# Patient Record
Sex: Female | Born: 1960 | Race: White | Hispanic: No | Marital: Married | Smoking: Never smoker
Health system: Southern US, Community
[De-identification: ages and names within clinical notes are randomized; demographics above are authoritative.]

## PROBLEM LIST (undated history)

## (undated) DIAGNOSIS — M810 Age-related osteoporosis without current pathological fracture: Secondary | ICD-10-CM

## (undated) DIAGNOSIS — E7800 Pure hypercholesterolemia, unspecified: Secondary | ICD-10-CM

## (undated) HISTORY — DX: Pure hypercholesterolemia, unspecified: E78.00

## (undated) HISTORY — DX: Age-related osteoporosis without current pathological fracture: M81.0

---

## 1997-07-02 ENCOUNTER — Other Ambulatory Visit: Admission: RE | Admit: 1997-07-02 | Discharge: 1997-07-02 | Payer: Self-pay | Admitting: Obstetrics and Gynecology

## 1997-08-13 ENCOUNTER — Ambulatory Visit (HOSPITAL_COMMUNITY): Admission: RE | Admit: 1997-08-13 | Discharge: 1997-08-13 | Payer: Self-pay | Admitting: Obstetrics and Gynecology

## 1997-08-21 ENCOUNTER — Other Ambulatory Visit: Admission: RE | Admit: 1997-08-21 | Discharge: 1997-08-21 | Payer: Self-pay | Admitting: *Deleted

## 1997-12-21 ENCOUNTER — Inpatient Hospital Stay (HOSPITAL_COMMUNITY): Admission: AD | Admit: 1997-12-21 | Discharge: 1997-12-21 | Payer: Self-pay | Admitting: Obstetrics & Gynecology

## 1997-12-23 ENCOUNTER — Inpatient Hospital Stay (HOSPITAL_COMMUNITY): Admission: AD | Admit: 1997-12-23 | Discharge: 1997-12-25 | Payer: Self-pay | Admitting: Obstetrics and Gynecology

## 1997-12-28 ENCOUNTER — Encounter (HOSPITAL_COMMUNITY): Admission: RE | Admit: 1997-12-28 | Discharge: 1998-01-11 | Payer: Self-pay | Admitting: Obstetrics and Gynecology

## 1998-03-21 ENCOUNTER — Other Ambulatory Visit: Admission: RE | Admit: 1998-03-21 | Discharge: 1998-03-21 | Payer: Self-pay | Admitting: *Deleted

## 1998-06-27 ENCOUNTER — Other Ambulatory Visit: Admission: RE | Admit: 1998-06-27 | Discharge: 1998-06-27 | Payer: Self-pay | Admitting: *Deleted

## 1999-07-03 ENCOUNTER — Other Ambulatory Visit: Admission: RE | Admit: 1999-07-03 | Discharge: 1999-07-03 | Payer: Self-pay | Admitting: Obstetrics and Gynecology

## 2001-02-23 ENCOUNTER — Encounter: Payer: Self-pay | Admitting: Obstetrics and Gynecology

## 2001-02-23 ENCOUNTER — Ambulatory Visit (HOSPITAL_COMMUNITY): Admission: RE | Admit: 2001-02-23 | Discharge: 2001-02-23 | Payer: Self-pay | Admitting: Obstetrics and Gynecology

## 2002-03-31 ENCOUNTER — Ambulatory Visit (HOSPITAL_COMMUNITY): Admission: RE | Admit: 2002-03-31 | Discharge: 2002-03-31 | Payer: Self-pay | Admitting: Obstetrics and Gynecology

## 2002-03-31 ENCOUNTER — Encounter: Payer: Self-pay | Admitting: Obstetrics and Gynecology

## 2003-04-02 ENCOUNTER — Ambulatory Visit (HOSPITAL_COMMUNITY): Admission: RE | Admit: 2003-04-02 | Discharge: 2003-04-02 | Payer: Self-pay | Admitting: Obstetrics and Gynecology

## 2004-03-24 ENCOUNTER — Other Ambulatory Visit: Admission: RE | Admit: 2004-03-24 | Discharge: 2004-03-24 | Payer: Self-pay | Admitting: Obstetrics and Gynecology

## 2004-04-03 ENCOUNTER — Ambulatory Visit (HOSPITAL_COMMUNITY): Admission: RE | Admit: 2004-04-03 | Discharge: 2004-04-03 | Payer: Self-pay | Admitting: Obstetrics and Gynecology

## 2005-03-25 ENCOUNTER — Other Ambulatory Visit: Admission: RE | Admit: 2005-03-25 | Discharge: 2005-03-25 | Payer: Self-pay | Admitting: Obstetrics and Gynecology

## 2005-04-01 ENCOUNTER — Encounter: Admission: RE | Admit: 2005-04-01 | Discharge: 2005-04-01 | Payer: Self-pay | Admitting: Obstetrics and Gynecology

## 2006-04-12 ENCOUNTER — Encounter: Admission: RE | Admit: 2006-04-12 | Discharge: 2006-04-12 | Payer: Self-pay | Admitting: Obstetrics and Gynecology

## 2007-02-01 IMAGING — MG MM MAMMO DIAG BILATERAL
2 series · 2 of 2 positions shown · non-contrast
Comparison: none

DIGITAL BILATERAL DIAGNOSTIC MAMMOGRAM AND RIGHT BREAST ULTRASOUND:
CLINICAL DATA: There is a dense fibroglandular densities.  There is no suspicious mass or malignant type 
microcalcifications in either breast.  Comparison study is dated 04-02-03.

On physical exam, I do not palpate a discrete mass in the right breast.  Sonographic evaluation of 
the inferior aspect of the right breast reveals normal fibroglandular tissue.

[R MLO]
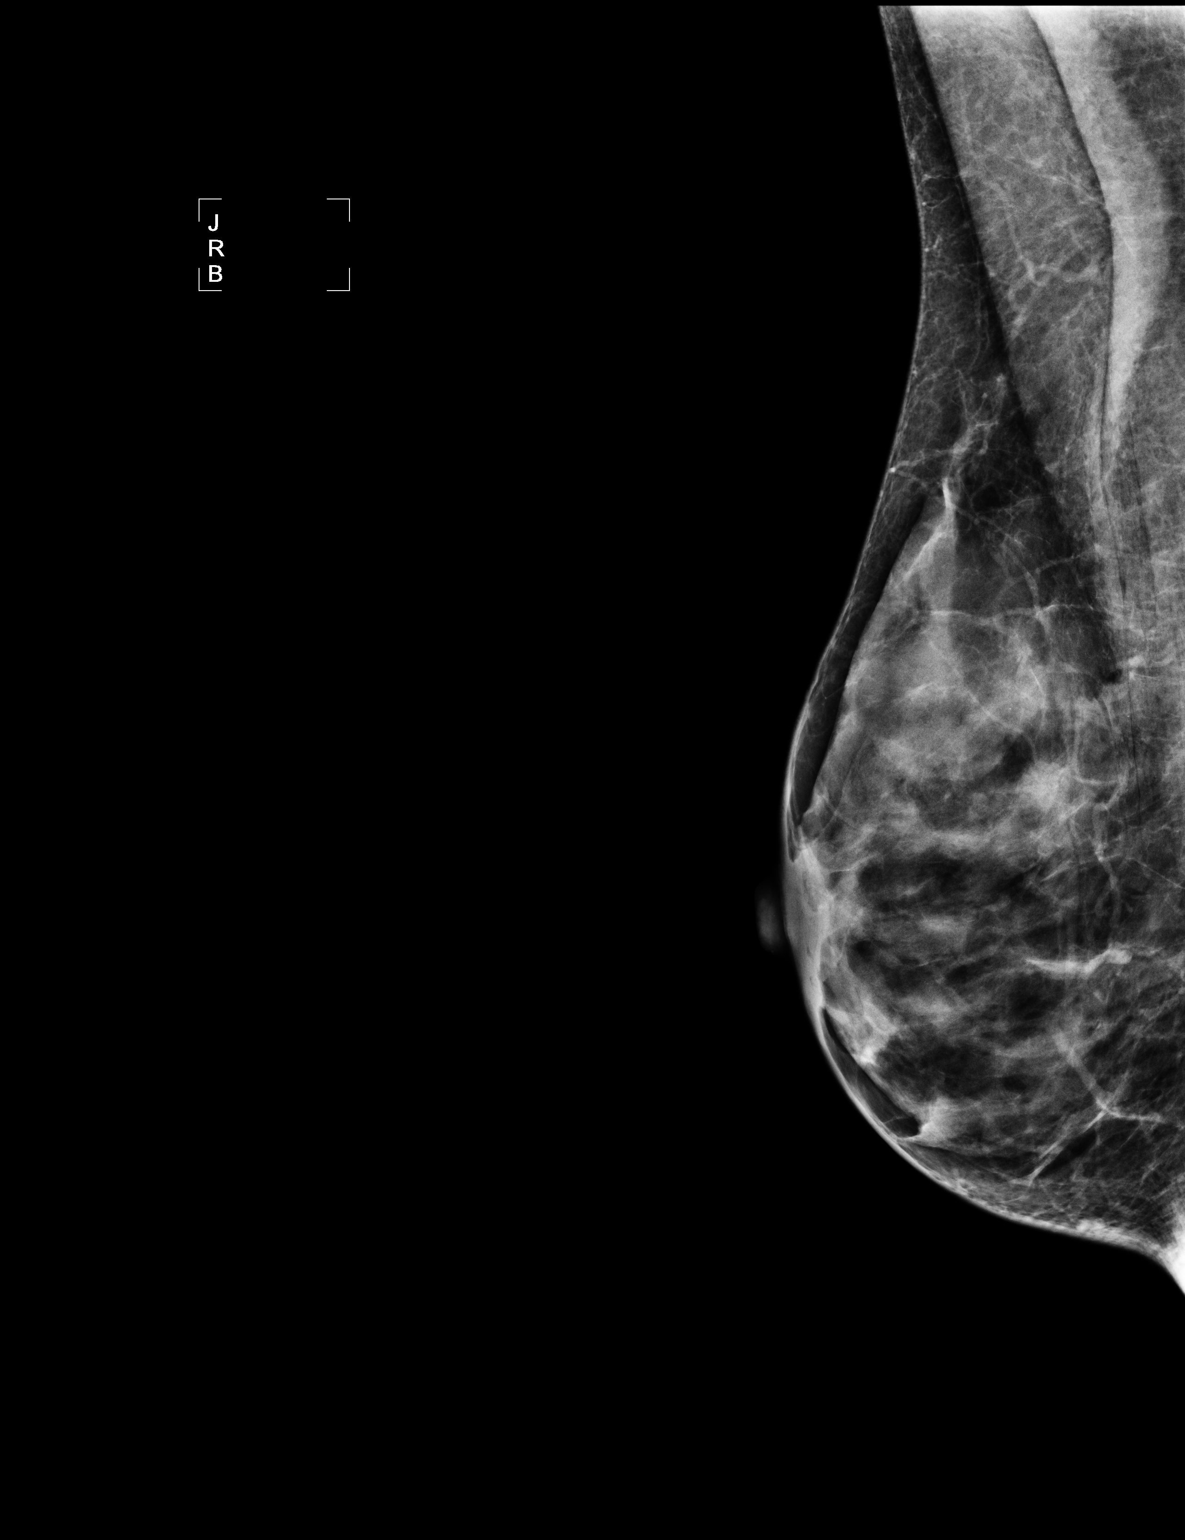

[R ML]
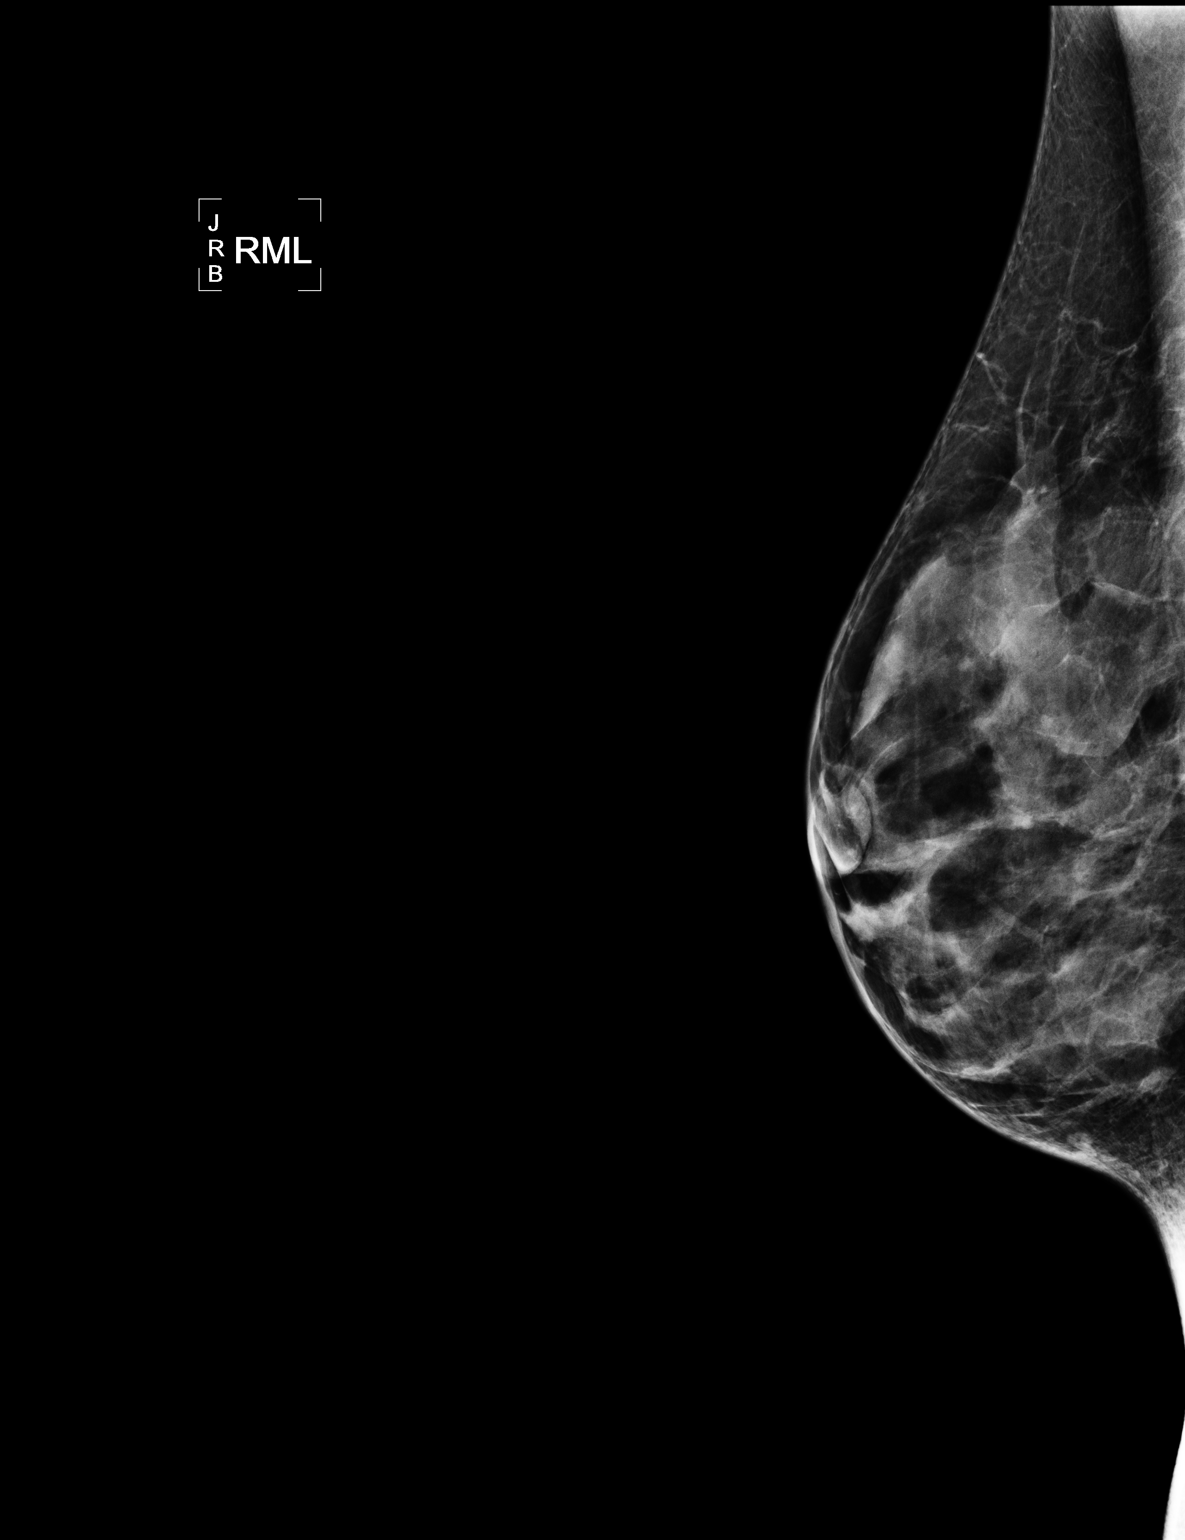

[2 of 2 positions shown; findings below may reference images not displayed]

IMPRESSION: No evidence of malignancy in either breast.  Screening mammogram in one year is recommended.

ASSESSMENT: Negative - BI-RADS 1

Routine screening mammogram in 1 year.

## 2007-04-18 ENCOUNTER — Encounter: Admission: RE | Admit: 2007-04-18 | Discharge: 2007-04-18 | Payer: Self-pay | Admitting: Obstetrics and Gynecology

## 2007-07-19 ENCOUNTER — Ambulatory Visit (HOSPITAL_COMMUNITY): Admission: RE | Admit: 2007-07-19 | Discharge: 2007-07-19 | Payer: Self-pay | Admitting: Obstetrics and Gynecology

## 2007-07-19 ENCOUNTER — Encounter (INDEPENDENT_AMBULATORY_CARE_PROVIDER_SITE_OTHER): Payer: Self-pay | Admitting: Obstetrics and Gynecology

## 2008-04-19 ENCOUNTER — Encounter: Admission: RE | Admit: 2008-04-19 | Discharge: 2008-04-19 | Payer: Self-pay | Admitting: Obstetrics and Gynecology

## 2009-05-01 ENCOUNTER — Encounter: Admission: RE | Admit: 2009-05-01 | Discharge: 2009-05-01 | Payer: Self-pay | Admitting: Obstetrics and Gynecology

## 2009-05-15 ENCOUNTER — Encounter: Admission: RE | Admit: 2009-05-15 | Discharge: 2009-05-15 | Payer: Self-pay | Admitting: Obstetrics and Gynecology

## 2010-04-22 ENCOUNTER — Other Ambulatory Visit: Payer: Self-pay | Admitting: Obstetrics and Gynecology

## 2010-04-22 DIAGNOSIS — Z1231 Encounter for screening mammogram for malignant neoplasm of breast: Secondary | ICD-10-CM

## 2010-05-23 ENCOUNTER — Ambulatory Visit
Admission: RE | Admit: 2010-05-23 | Discharge: 2010-05-23 | Disposition: A | Discharge status: Home / Self Care | Payer: BC Managed Care – PPO | Source: Ambulatory Visit | Attending: Obstetrics and Gynecology | Admitting: Obstetrics and Gynecology

## 2010-05-23 DIAGNOSIS — Z1231 Encounter for screening mammogram for malignant neoplasm of breast: Secondary | ICD-10-CM

## 2010-06-17 NOTE — Op Note (Signed)
NAMEKRISTIANNE, Emily Castillo NO.:  [id number]   MEDICAL RECORD NO.:  [id number]          PATIENT TYPE:  AMB   LOCATION:  [facility]                           FACILITY:  [facility]   PHYSICIAN:  Osborn Coho, M.D.   DATE OF BIRTH:  December 06, 1960   DATE OF PROCEDURE:  07/19/2007  DATE OF DISCHARGE:                               OPERATIVE REPORT   PREOPERATIVE DIAGNOSES:  1. Menorrhagia.  2. Polyps.   POSTOPERATIVE DIAGNOSES:  1. Menorrhagia.  2. Polyps.   PROCEDURE:  1. Hysteroscopy.  2. D&C.   ATTENDING:  Osborn Coho, MD   ANESTHESIA:  General via LMA.   FLUIDS:  1600 mL.   URINE OUTPUT:  Straight cath prior to procedure.   HYSTEROSCOPIC FLUID DEFICIT:  55 mL of LR.   ESTIMATED BLOOD LOSS:  Minimal.   COMPLICATIONS:  None.   SPECIMENS TO PATHOLOGY:  Endometrial curettings with polyps.   PROCEDURE:  The patient was taken to the operating room after the risks,  benefits, and alternatives discussed with the patient.  The patient  verbalized understanding and consent signed and witnessed.  The patient  was placed under general per anesthesia and prepped and draped in a  normal sterile fashion in the dorsal lithotomy position.  A bivalve  speculum was placed in the patient's vagina and the anterior lip of the  cervix grasped with single-tooth tenaculum.  A paracervical block was  administered using a total of 10 mL of 1% lidocaine.  The uterus was  sounded to approximately 8-9 cm and hysteroscope was introduced and  polypoid lesion noted on the posterior wall of the uterus near the  fundus.  Curettage was performed until a gray texture was noted and  curettings with polyp sent to  pathology.  Hysteroscope was introduced again and no obvious  intracavitary lesion was noted.  All instruments were removed.  There  was good hemostasis at the tenaculum site.  Count was correct.  The  patient tolerated the procedure well and is currently awaiting transfer  to the recovery  room in good condition.      Osborn Coho, M.D.  Electronically Signed     AR/MEDQ  D:  07/19/2007  T:  07/20/2007  Job:  [id number]

## 2010-10-30 LAB — CBC
HCT: 39.2
Hemoglobin: 13.5
MCHC: 34.5
RDW: 15
WBC: 6.4

## 2010-10-30 LAB — PREGNANCY, URINE: Preg Test, Ur: NEGATIVE

## 2011-03-18 IMAGING — MG MM DIGITAL DIAG LTD L
3 series · 3 of 3 positions shown · non-contrast
Comparison: 05/01/2009, 04/19/2008, 04/18/2007, 04/01/2005.

CLINICAL DATA: Possible mass left breast identified on recent
screening mammogram.

DIGITAL DIAGNOSTIC LEFT MAMMOGRAM WITHOUT CAD AND LEFT BREAST
ULTRASOUND:

[L MLO]
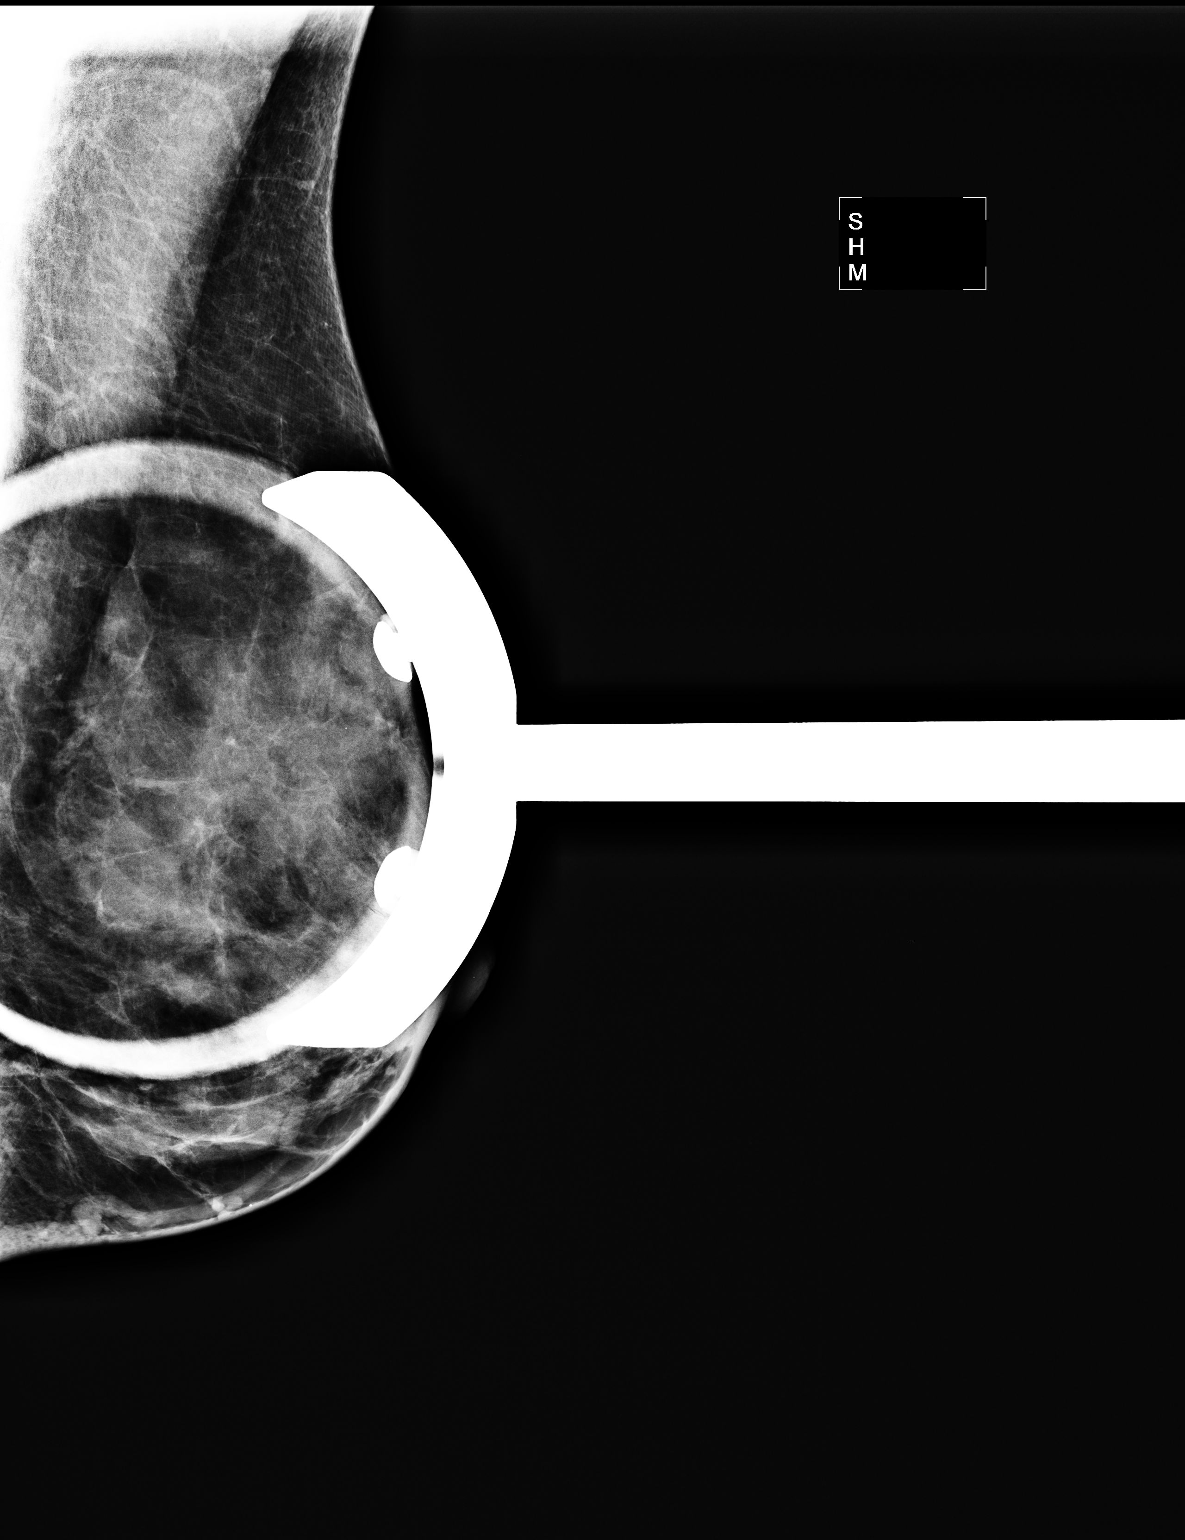

[L XCCL]
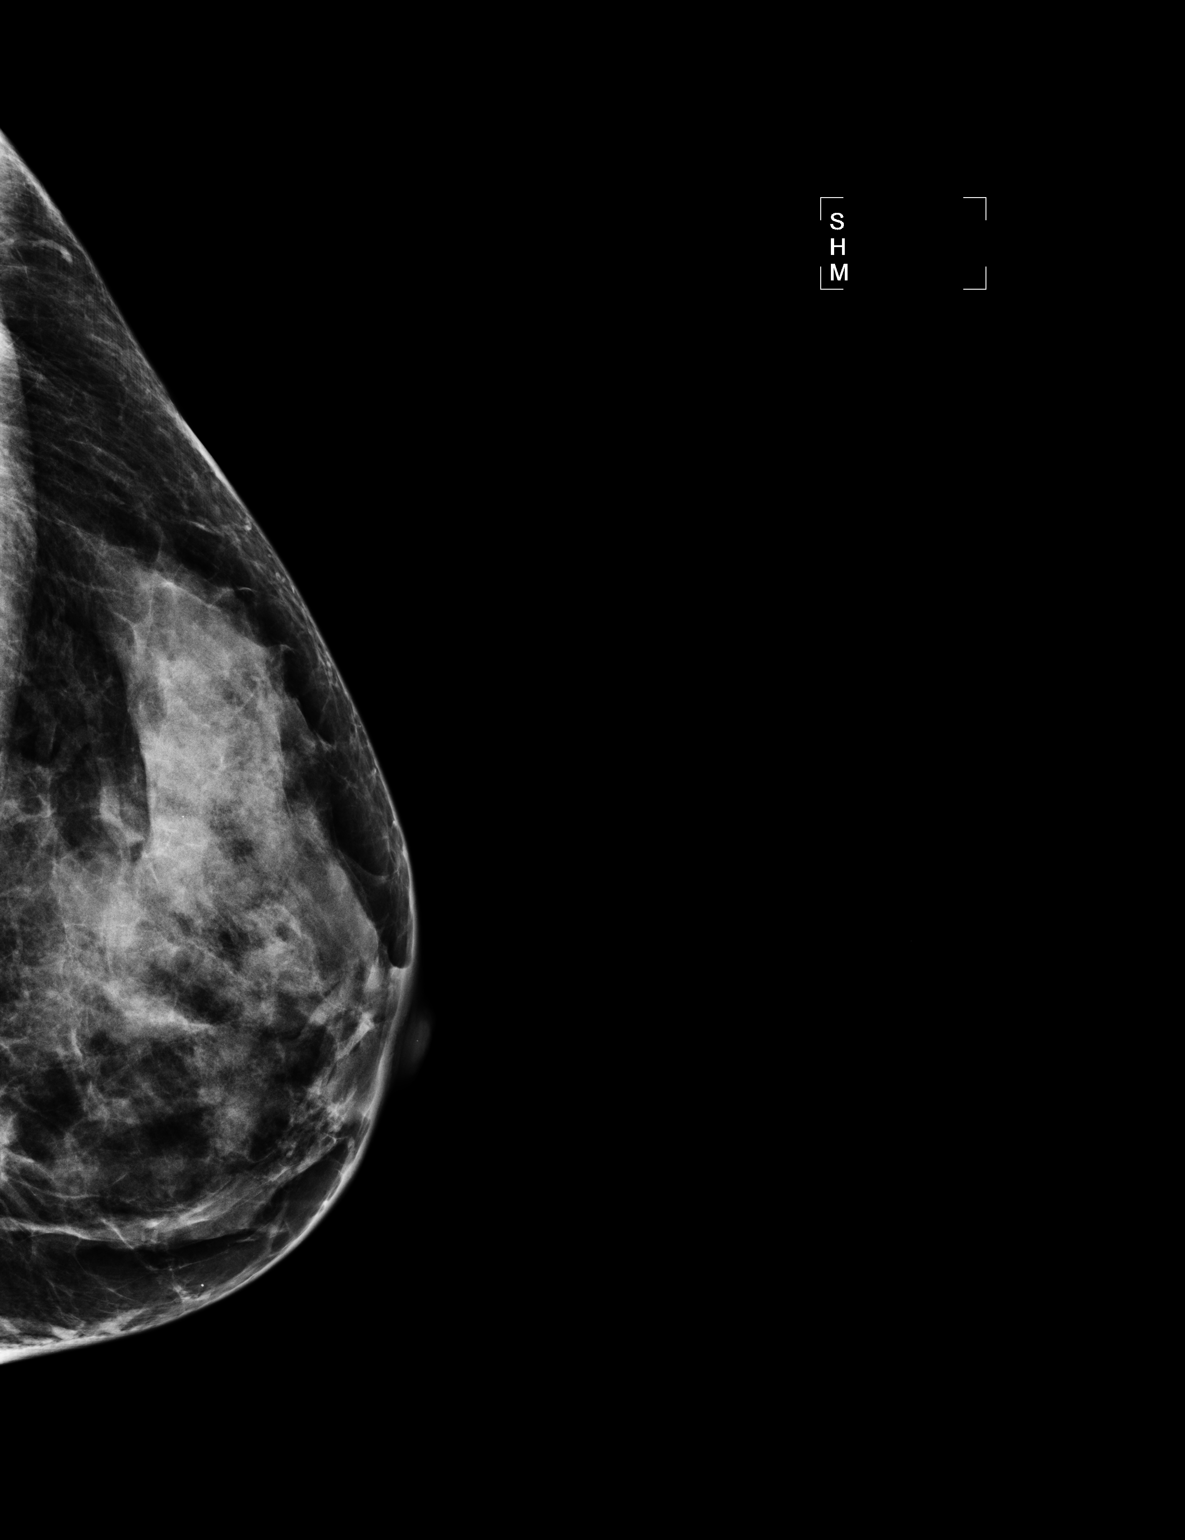

[L ML]
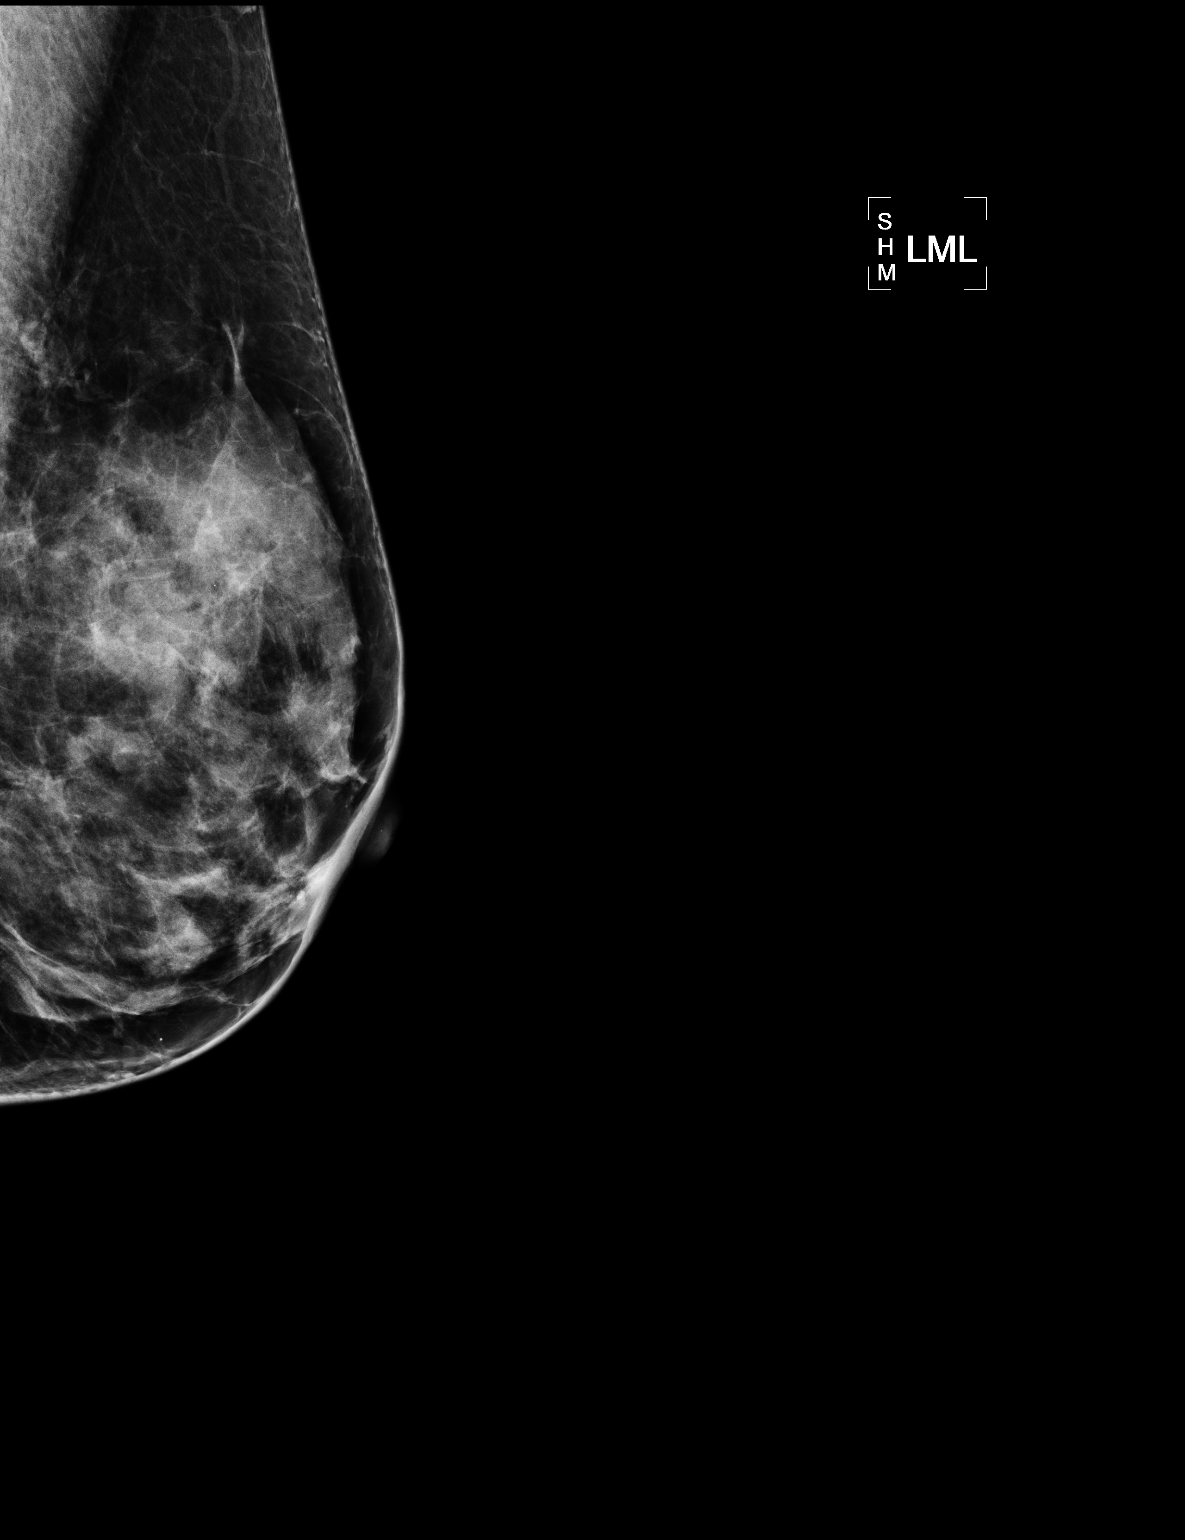

[3 of 3 positions shown; findings below may reference images not displayed]

FINDINGS: Focal spot compression view of the left breast shows
extremely dense breast parenchyma without a discrete mass or
distortion.  On a 90 degrees lateral view of the left breast and an
exaggerated CC lateral view of the left breast, extremely dense
breast parenchyma is imaged; there are no suspicious findings.

On physical exam, no mass is palpated in the upper left breast.

Ultrasound is performed, showing dense breast parenchyma.  No solid
or cystic mass or acoustical shadowing is identified.
IMPRESSION: No evidence of malignancy is identified in the left breast.
Bilateral screening mammogram in 1 year is recommended.

BI-RADS CATEGORY 1:  Negative.

## 2011-04-15 ENCOUNTER — Other Ambulatory Visit: Payer: Self-pay | Admitting: Obstetrics and Gynecology

## 2011-04-15 DIAGNOSIS — Z1231 Encounter for screening mammogram for malignant neoplasm of breast: Secondary | ICD-10-CM

## 2011-04-22 ENCOUNTER — Ambulatory Visit (INDEPENDENT_AMBULATORY_CARE_PROVIDER_SITE_OTHER): Payer: BC Managed Care – PPO | Admitting: Obstetrics and Gynecology

## 2011-04-22 DIAGNOSIS — Z01419 Encounter for gynecological examination (general) (routine) without abnormal findings: Secondary | ICD-10-CM

## 2011-04-22 DIAGNOSIS — Z1389 Encounter for screening for other disorder: Secondary | ICD-10-CM

## 2011-05-20 ENCOUNTER — Telehealth: Payer: Self-pay

## 2011-05-20 NOTE — Telephone Encounter (Signed)
LM for pt to cb on my direct ext. To discuss lab results. JO CMA

## 2011-05-21 ENCOUNTER — Telehealth: Payer: Self-pay

## 2011-05-21 NOTE — Telephone Encounter (Signed)
Notified pt of labwork results and at her request I faxed them to her. Emily Castillo A

## 2011-05-29 ENCOUNTER — Ambulatory Visit
Admission: RE | Admit: 2011-05-29 | Discharge: 2011-05-29 | Disposition: A | Discharge status: Home / Self Care | Payer: BC Managed Care – PPO | Source: Ambulatory Visit | Attending: Obstetrics and Gynecology | Admitting: Obstetrics and Gynecology

## 2011-05-29 DIAGNOSIS — Z1231 Encounter for screening mammogram for malignant neoplasm of breast: Secondary | ICD-10-CM

## 2012-04-29 ENCOUNTER — Other Ambulatory Visit: Payer: Self-pay

## 2012-04-29 DIAGNOSIS — Z1231 Encounter for screening mammogram for malignant neoplasm of breast: Secondary | ICD-10-CM

## 2012-06-03 ENCOUNTER — Ambulatory Visit
Admission: RE | Admit: 2012-06-03 | Discharge: 2012-06-03 | Disposition: A | Discharge status: Home / Self Care | Payer: BC Managed Care – PPO | Source: Ambulatory Visit

## 2012-06-03 DIAGNOSIS — Z1231 Encounter for screening mammogram for malignant neoplasm of breast: Secondary | ICD-10-CM

## 2013-05-22 ENCOUNTER — Other Ambulatory Visit: Payer: Self-pay

## 2013-05-22 DIAGNOSIS — Z1231 Encounter for screening mammogram for malignant neoplasm of breast: Secondary | ICD-10-CM

## 2013-06-09 ENCOUNTER — Encounter (INDEPENDENT_AMBULATORY_CARE_PROVIDER_SITE_OTHER): Payer: Self-pay

## 2013-06-09 ENCOUNTER — Ambulatory Visit
Admission: RE | Admit: 2013-06-09 | Discharge: 2013-06-09 | Disposition: A | Discharge status: Home / Self Care | Payer: 59 | Source: Ambulatory Visit

## 2013-06-09 DIAGNOSIS — Z1231 Encounter for screening mammogram for malignant neoplasm of breast: Secondary | ICD-10-CM

## 2014-02-02 DIAGNOSIS — C439 Malignant melanoma of skin, unspecified: Secondary | ICD-10-CM

## 2014-02-02 HISTORY — DX: Malignant melanoma of skin, unspecified: C43.9

## 2014-06-01 ENCOUNTER — Other Ambulatory Visit: Payer: Self-pay

## 2014-06-01 DIAGNOSIS — Z1231 Encounter for screening mammogram for malignant neoplasm of breast: Secondary | ICD-10-CM

## 2014-06-15 ENCOUNTER — Ambulatory Visit
Admission: RE | Admit: 2014-06-15 | Discharge: 2014-06-15 | Disposition: A | Discharge status: Home / Self Care | Payer: 59 | Source: Ambulatory Visit

## 2014-06-15 DIAGNOSIS — Z1231 Encounter for screening mammogram for malignant neoplasm of breast: Secondary | ICD-10-CM

## 2015-04-29 ENCOUNTER — Other Ambulatory Visit: Payer: Self-pay

## 2015-04-29 DIAGNOSIS — Z1231 Encounter for screening mammogram for malignant neoplasm of breast: Secondary | ICD-10-CM

## 2015-06-21 ENCOUNTER — Ambulatory Visit
Admission: RE | Admit: 2015-06-21 | Discharge: 2015-06-21 | Disposition: A | Discharge status: Home / Self Care | Payer: 59 | Source: Ambulatory Visit | Attending: Obstetrics and Gynecology | Admitting: Obstetrics and Gynecology

## 2015-06-21 DIAGNOSIS — Z1231 Encounter for screening mammogram for malignant neoplasm of breast: Secondary | ICD-10-CM

## 2016-05-13 ENCOUNTER — Other Ambulatory Visit: Payer: Self-pay | Admitting: Obstetrics and Gynecology

## 2016-05-13 DIAGNOSIS — Z1231 Encounter for screening mammogram for malignant neoplasm of breast: Secondary | ICD-10-CM

## 2016-06-26 ENCOUNTER — Ambulatory Visit
Admission: RE | Admit: 2016-06-26 | Discharge: 2016-06-26 | Disposition: A | Discharge status: Home / Self Care | Payer: 59 | Source: Ambulatory Visit | Attending: Obstetrics and Gynecology | Admitting: Obstetrics and Gynecology

## 2016-06-26 DIAGNOSIS — Z1231 Encounter for screening mammogram for malignant neoplasm of breast: Secondary | ICD-10-CM

## 2017-05-18 ENCOUNTER — Other Ambulatory Visit: Payer: Self-pay | Admitting: Obstetrics and Gynecology

## 2017-05-18 DIAGNOSIS — Z1231 Encounter for screening mammogram for malignant neoplasm of breast: Secondary | ICD-10-CM

## 2017-07-02 ENCOUNTER — Ambulatory Visit
Admission: RE | Admit: 2017-07-02 | Discharge: 2017-07-02 | Disposition: A | Discharge status: Home / Self Care | Payer: 59 | Source: Ambulatory Visit | Attending: Obstetrics and Gynecology | Admitting: Obstetrics and Gynecology

## 2017-07-02 DIAGNOSIS — Z1231 Encounter for screening mammogram for malignant neoplasm of breast: Secondary | ICD-10-CM

## 2018-06-14 ENCOUNTER — Other Ambulatory Visit: Payer: Self-pay | Admitting: Obstetrics and Gynecology

## 2018-06-14 DIAGNOSIS — Z1231 Encounter for screening mammogram for malignant neoplasm of breast: Secondary | ICD-10-CM

## 2018-08-12 ENCOUNTER — Ambulatory Visit
Admission: RE | Admit: 2018-08-12 | Discharge: 2018-08-12 | Disposition: A | Discharge status: Home / Self Care | Payer: 59 | Source: Ambulatory Visit | Attending: Obstetrics and Gynecology | Admitting: Obstetrics and Gynecology

## 2018-08-12 ENCOUNTER — Other Ambulatory Visit: Payer: Self-pay

## 2018-08-12 DIAGNOSIS — Z1231 Encounter for screening mammogram for malignant neoplasm of breast: Secondary | ICD-10-CM

## 2018-10-19 ENCOUNTER — Other Ambulatory Visit: Payer: Self-pay | Admitting: Obstetrics and Gynecology

## 2019-04-14 ENCOUNTER — Other Ambulatory Visit: Payer: Self-pay | Admitting: Family Medicine

## 2019-04-14 DIAGNOSIS — M8580 Other specified disorders of bone density and structure, unspecified site: Secondary | ICD-10-CM

## 2019-04-14 DIAGNOSIS — Z1231 Encounter for screening mammogram for malignant neoplasm of breast: Secondary | ICD-10-CM

## 2019-08-25 ENCOUNTER — Ambulatory Visit: Payer: 59

## 2019-08-25 ENCOUNTER — Other Ambulatory Visit: Payer: 59

## 2019-09-01 ENCOUNTER — Other Ambulatory Visit: Payer: Self-pay

## 2019-09-01 ENCOUNTER — Ambulatory Visit
Admission: RE | Admit: 2019-09-01 | Discharge: 2019-09-01 | Disposition: A | Discharge status: Home / Self Care | Payer: 59 | Source: Ambulatory Visit | Attending: Family Medicine | Admitting: Family Medicine

## 2019-09-01 DIAGNOSIS — Z1231 Encounter for screening mammogram for malignant neoplasm of breast: Secondary | ICD-10-CM

## 2019-09-01 DIAGNOSIS — M8580 Other specified disorders of bone density and structure, unspecified site: Secondary | ICD-10-CM

## 2020-08-13 ENCOUNTER — Other Ambulatory Visit: Payer: Self-pay | Admitting: Family Medicine

## 2020-08-13 DIAGNOSIS — Z1231 Encounter for screening mammogram for malignant neoplasm of breast: Secondary | ICD-10-CM

## 2020-10-04 ENCOUNTER — Ambulatory Visit: Payer: 59

## 2020-10-11 ENCOUNTER — Ambulatory Visit
Admission: RE | Admit: 2020-10-11 | Discharge: 2020-10-11 | Disposition: A | Discharge status: Home / Self Care | Payer: 59 | Source: Ambulatory Visit | Attending: Family Medicine | Admitting: Family Medicine

## 2020-10-11 ENCOUNTER — Other Ambulatory Visit: Payer: Self-pay

## 2020-10-11 DIAGNOSIS — Z1231 Encounter for screening mammogram for malignant neoplasm of breast: Secondary | ICD-10-CM

## 2021-07-10 ENCOUNTER — Other Ambulatory Visit: Payer: Self-pay | Admitting: Family Medicine

## 2021-07-10 DIAGNOSIS — M810 Age-related osteoporosis without current pathological fracture: Secondary | ICD-10-CM

## 2021-09-10 ENCOUNTER — Other Ambulatory Visit: Payer: Self-pay | Admitting: Family Medicine

## 2021-09-10 DIAGNOSIS — Z1231 Encounter for screening mammogram for malignant neoplasm of breast: Secondary | ICD-10-CM

## 2021-10-14 ENCOUNTER — Ambulatory Visit
Admission: RE | Admit: 2021-10-14 | Discharge: 2021-10-14 | Disposition: A | Discharge status: Home / Self Care | Payer: Commercial Managed Care - HMO | Source: Ambulatory Visit | Attending: Family Medicine | Admitting: Family Medicine

## 2021-10-14 DIAGNOSIS — Z1231 Encounter for screening mammogram for malignant neoplasm of breast: Secondary | ICD-10-CM

## 2021-11-05 ENCOUNTER — Other Ambulatory Visit: Payer: Self-pay | Admitting: Family Medicine

## 2021-11-05 DIAGNOSIS — R4701 Aphasia: Secondary | ICD-10-CM

## 2021-11-18 ENCOUNTER — Ambulatory Visit
Admission: RE | Admit: 2021-11-18 | Discharge: 2021-11-18 | Disposition: A | Discharge status: Home / Self Care | Payer: 59 | Source: Ambulatory Visit | Attending: Family Medicine | Admitting: Family Medicine

## 2021-11-18 DIAGNOSIS — R4701 Aphasia: Secondary | ICD-10-CM

## 2021-11-26 ENCOUNTER — Encounter: Payer: Self-pay | Admitting: *Deleted

## 2021-11-27 NOTE — Progress Notes (Signed)
[facility]  PATIENT: Emily Castillo DOB: [birth year]  REFERRING CLINICIAN: Lois Huxley, PA HISTORY FROM: self, husband REASON FOR VISIT: vision changes, aphasia, headache   HISTORICAL  CHIEF COMPLAINT:  Chief Complaint  Patient presents with   New Patient (Initial Visit)    Pt reports feeling fine. She states she has not had any other symptoms. Room 8 with husband    HISTORY OF PRESENT ILLNESS:  The patient presents for evaluation of aphasia and vision changes. On October 1st she suddenly saw "sunspots" in her central vision. This lasted for ~15 minutes. She then developed difficulty getting her words out. Knew what she wanted to say but had trouble saying it. She understood what people were saying to her. This lasted for 10 minutes. About 15 minutes later she developed a headache. Describes this as unilateral occipital pain which was not associated without associated photophobia, nausea, or phonophobia. Does note that headache felt different from her typical headaches. She took Tylenol and went to sleep. Headache resolved after ~3 hours.   Notes that she has had previous episodes of lights in her vision which last 10-15 minutes at a time. This happens 2-3 times per year. Does not remember if these were associated with headaches.  MRI brain on 11/18/21 was unremarkable.  OTHER MEDICAL CONDITIONS: HLD   REVIEW OF SYSTEMS: Full 14 system review of systems performed and negative with exception of: headaches, vision changes, speech changes  ALLERGIES: Allergies  Allergen Reactions   Sulfa Antibiotics Hives    HOME MEDICATIONS: No outpatient medications prior to visit.   No facility-administered medications prior to visit.    PAST MEDICAL HISTORY: Past Medical History:  Diagnosis Date   Hypercholesterolemia    Melanoma ([place]) 2016   Osteoporosis     PAST SURGICAL HISTORY: No past surgical history on file.  FAMILY HISTORY: Family History   Problem Relation Age of Onset   Stroke Mother    Hypertension Mother    Diabetes Mother    Stroke Father    Alzheimer's disease Father    Hypertension Sister    Hypertension Sister    Hypertension Brother    Diabetes Brother    Hypertension Brother    Breast cancer Maternal Grandmother 63   Heart attack Maternal Grandfather    Hypertension Paternal Grandmother    Cancer Paternal Grandfather     SOCIAL HISTORY: Social History   Socioeconomic History   Marital status: Married    Spouse name: Elenore Rota   Number of children: 4   Years of education: Not on file   Highest education level: Not on file  Occupational History    Comment: retired  Tobacco Use   Smoking status: Never   Smokeless tobacco: Never  Substance and Sexual Activity   Alcohol use: Yes    Comment: rare   Drug use: Never   Sexual activity: Not on file  Other Topics Concern   Not on file  Social History Narrative   Lives with husband   Caffeine, coffee 1 a day, occas tea/soda   Social Determinants of Health   Financial Resource Strain: Not on file  Food Insecurity: Not on file  Transportation Needs: Not on file  Physical Activity: Not on file  Stress: Not on file  Social Connections: Not on file  Intimate Partner Violence: Not on file     PHYSICAL EXAM  GENERAL EXAM/CONSTITUTIONAL: Vitals: Vitals:   11/28/21 0825  BP: 128/73  Pulse: (!) 55  Weight: 105  lb 6 oz (47.8 kg)  Height: '5\' 3"'$  (1.6 m)   Body mass index is 18.67 kg/m. Wt Readings from Last 3 Encounters:  11/28/21 105 lb 6 oz (47.8 kg)   NEUROLOGIC: MENTAL STATUS:  awake, alert, oriented to person, place and time recent and remote memory intact normal attention and concentration  CRANIAL NERVE:  2nd, 3rd, 4th, 6th - pupils equal and reactive to light, visual fields full to confrontation, extraocular muscles intact, no nystagmus 5th - facial sensation symmetric 7th - facial strength symmetric 8th - hearing intact 9th -  palate elevates symmetrically, uvula midline 11th - shoulder shrug symmetric 12th - tongue protrusion midline  MOTOR:  normal bulk and tone, full strength in the BUE, BLE  SENSORY:  normal and symmetric to light touch all 4 extremities  COORDINATION:  finger-nose-finger intact bilaterally  REFLEXES:  deep tendon reflexes present and symmetric  GAIT/STATION:  normal   DIAGNOSTIC DATA (LABS, IMAGING, TESTING) - I reviewed patient records, labs, notes, testing and imaging myself where available.  Lab Results  Component Value Date   WBC 6.4 07/19/2007   HGB 13.5 07/19/2007   HCT 39.2 07/19/2007   MCV 87.2 07/19/2007   PLT 226 07/19/2007   07/2020 TSH, CBC, CMP wnl    ASSESSMENT AND PLAN  61 y.o. year old female with a history of HLD who presents for evaluation of vision changes and aphasia associated with headache. MRI brain and neurological exam are normal. Her symptoms are most consistent with migraine aura. She has likely had visual auras for a long time based on her description of intermittent sunspots in her vision. Counseled that aura symptoms are not always associated with a typical migraine headache. She would prefer not to take medications. Discussed supplement options and dietary triggers for migraine.   1. Migraine with aura and without status migrainosus, not intractable      PLAN: -Supplement information provided (Mg, B2, CoQ10) -Return to clinic if symptoms worsen or new neurologic symptoms develop   Return if symptoms worsen or fail to improve.    Genia Harold, MD 11/28/21 9:05 AM  I spent an average of 32 minutes chart reviewing and counseling the patient, with at least 50% of the time face to face with the patient.   [facility] [address] [place], [place] [address] [phone]

## 2021-11-28 ENCOUNTER — Ambulatory Visit: Payer: Commercial Managed Care - HMO | Admitting: Psychiatry

## 2021-11-28 VITALS — BP 128/73 | HR 55 | Ht 63.0 in | Wt 105.4 lb

## 2021-11-28 DIAGNOSIS — G43109 Migraine with aura, not intractable, without status migrainosus: Secondary | ICD-10-CM | POA: Diagnosis not present

## 2021-11-28 NOTE — Patient Instructions (Addendum)
Migraines are moderate to severe headaches which generally have throbbing pain, sensitivity to light/sound, and nausea. Migraines can be preceded by neurological symptoms called auras. Auras can have many different symptoms including vision changes (zigzags, kaleidoscope vision, flashing lights, blurred vision), numbness and tingling (usually on once side of the hand or face), difficulty with language (inability to read or speak), and weakness on one side. Many times these symptoms will occur before a migraine headache, but auras can occur without a headache as well. Auras can naturally change over time and may convert from one type to another (people with visual aura may eventually develop a sensory aura, etc).   Natural supplements that can reduce migraines: Magnesium Oxide or Magnesium Glycinate 500 mg at bed (up to 800 mg daily) Coenzyme Q10 300 mg in AM Vitamin B2- 200 mg twice a day  Add 1 supplement at a time since even natural supplements can have undesirable side effects. You can sometimes buy supplements cheaper (especially Coenzyme Q10) at www.https://compton-perez.com/ or at [employer].  Magnesium: Magnesium (250 mg twice a day or 500 mg at bed) has a relaxant effect on smooth muscles such as blood vessels. Individuals suffering from frequent or daily headache usually have low magnesium levels which can be increase with daily supplementation of 400-800 mg. Three trials found 40-90% average headache reduction  when used as a preventative. Magnesium also demonstrated the benefit in menstrually related migraine.  Magnesium is part of the messenger system in the serotonin cascade and it is a good muscle relaxant.  It is also useful for constipation. Good sources include nuts, whole grains, and tomatoes. Side Effects: loose stool/diarrhea  Riboflavin (vitamin B 2) 200 mg twice a day. This vitamin assists nerve cells in the production of ATP a principal energy storing molecule.  It is necessary for many chemical  reactions in the body.  There have been at least 3 clinical trials of riboflavin using 400 mg per day all of which suggested that migraine frequency can be decreased.  All 3 trials showed significant improvement in over half of migraine sufferers.  The supplement is found in bread, cereal, milk, meat, and poultry.  Most Americans get more riboflavin than the recommended daily allowance, however riboflavin deficiency is not necessary for the supplements to help prevent headache. Side effects: energizing, green urine  Coenzyme Q10: This is present in almost all cells in the body and is critical component for the conversion of energy.  Recent studies have shown that a nutritional supplement of CoQ10 can reduce the frequency of migraine attacks by improving the energy production of cells as with riboflavin.  Doses of 150 mg twice a day have been shown to be effective.   HEADACHE DIET: Foods and beverages which may trigger migraine Note that only 20% of headache patients are food sensitive. You will know if you are food sensitive if you get a headache consistently 20 minutes to 2 hours after eating a certain food. Only cut out a food if it causes headaches, otherwise you might remove foods you enjoy! What matters most for diet is to eat a well balanced healthy diet full of vegetables and low fat protein, and to not miss meals.  Chocolate, other sweets ALL cheeses except cottage and cream cheese Dairy products, yogurt, sour cream, ice cream Liver Meat extracts (Bovril, Marmite, meat tenderizers) Meats or fish which have undergone aging, fermenting, pickling or smoking. These include: Hotdogs,salami,Lox,sausage, mortadellas,smoked salmon, pepperoni, Pickled herring Pods of broad bean (English beans, Chinese pea  pods, [place] (fava) beans, lima and navy beans Ripe avocado, ripe banana Yeast extracts or active yeast preparations such as Brewer's or Fleishman's (commercial bakes goods are permitted) Tomato  based foods, pizza (lasagna, etc.)  MSG (monosodium glutamate) is disguised as many things; look for these common aliases: Monopotassium glutamate Autolysed yeast Hydrolysed protein Sodium caseinate "flavorings" "all natural preservatives" Nutrasweet  Avoid all other foods that convincingly provoke headaches.  Resources: The Dizzy Lu Duffel Your Headache Diet, migrainestrong.com  https://www.aguirre.org/  Caffeine and Migraine For patients that have migraine, caffeine intake more than 3 days per week can lead to dependency and increased migraine frequency. I would recommend cutting back on your caffeine intake as best you can. The recommended amount of caffeine is 200-300 mg daily, although migraine patients may experience dependency at even lower doses. While you may notice an increase in headache temporarily, cutting back will be helpful for headaches in the long run. For more information on caffeine and migraine, visit: https://americanmigrainefoundation.org/resource-library/caffeine-and-migraine/  Headache Prevention Strategies:  1. Maintain a headache diary; learn to identify and avoid triggers.  - This can be a simple note where you log when you had a headache, associated symptoms, and medications used - There are several smartphone apps developed to help track migraines: Migraine Buddy, Migraine Monitor, Curelator N1-Headache App  Common triggers include: Emotional triggers: Emotional/Upset family or friends Emotional/Upset occupation Business reversal/success Anticipation anxiety Crisis-serious Post-crisis periodNew job/position   Physical triggers: Vacation Day Weekend Strenuous Exercise High Altitude Location New Move Menstrual Day Physical Illness Oversleep/Not enough sleep Weather changes Light: Photophobia or light sesnitivity treatment involves a balance between desensitization and reduction in overly strong  input. Use dark polarized glasses outside, but not inside. Avoid bright or fluorescent light, but do not dim environment to the point that going into a normally lit room hurts. Consider FL-41 tint lenses, which reduce the most irritating wavelengths without blocking too much light.  These can be obtained at axonoptics.com or theraspecs.com Foods: see list above.  2. Limit use of acute treatments (over-the-counter medications, triptans, etc.) to no more than 2 days per week or 10 days per month to prevent medication overuse headache (rebound headache).    3. Follow a regular schedule (including weekends and holidays): Don't skip meals. Eat a balanced diet. 8 hours of sleep nightly. Minimize stress. Exercise 30 minutes per day. Being overweight is associated with a 5 times increased risk of chronic migraine. Keep well hydrated and drink 6-8 glasses of water per day.

## 2021-12-18 ENCOUNTER — Ambulatory Visit
Admission: RE | Admit: 2021-12-18 | Discharge: 2021-12-18 | Disposition: A | Discharge status: Home / Self Care | Payer: Commercial Managed Care - HMO | Source: Ambulatory Visit | Attending: Family Medicine | Admitting: Family Medicine

## 2021-12-18 DIAGNOSIS — M810 Age-related osteoporosis without current pathological fracture: Secondary | ICD-10-CM

## 2022-05-17 DIAGNOSIS — Z681 Body mass index (BMI) 19 or less, adult: Secondary | ICD-10-CM | POA: Diagnosis not present

## 2022-05-17 DIAGNOSIS — H10021 Other mucopurulent conjunctivitis, right eye: Secondary | ICD-10-CM | POA: Diagnosis not present

## 2022-05-18 DIAGNOSIS — S9002XA Contusion of left ankle, initial encounter: Secondary | ICD-10-CM | POA: Diagnosis not present

## 2022-06-26 DIAGNOSIS — M810 Age-related osteoporosis without current pathological fracture: Secondary | ICD-10-CM | POA: Diagnosis not present

## 2022-06-26 DIAGNOSIS — Z85820 Personal history of malignant melanoma of skin: Secondary | ICD-10-CM | POA: Diagnosis not present

## 2022-06-26 DIAGNOSIS — Z8249 Family history of ischemic heart disease and other diseases of the circulatory system: Secondary | ICD-10-CM | POA: Diagnosis not present

## 2022-06-26 DIAGNOSIS — Z882 Allergy status to sulfonamides status: Secondary | ICD-10-CM | POA: Diagnosis not present

## 2022-06-26 DIAGNOSIS — Z823 Family history of stroke: Secondary | ICD-10-CM | POA: Diagnosis not present

## 2022-06-26 DIAGNOSIS — Z7983 Long term (current) use of bisphosphonates: Secondary | ICD-10-CM | POA: Diagnosis not present

## 2022-06-26 DIAGNOSIS — R030 Elevated blood-pressure reading, without diagnosis of hypertension: Secondary | ICD-10-CM | POA: Diagnosis not present

## 2022-07-22 DIAGNOSIS — Z0000 Encounter for general adult medical examination without abnormal findings: Secondary | ICD-10-CM | POA: Diagnosis not present

## 2022-07-22 DIAGNOSIS — G43109 Migraine with aura, not intractable, without status migrainosus: Secondary | ICD-10-CM | POA: Diagnosis not present

## 2022-07-22 DIAGNOSIS — M818 Other osteoporosis without current pathological fracture: Secondary | ICD-10-CM | POA: Diagnosis not present

## 2022-07-22 DIAGNOSIS — E785 Hyperlipidemia, unspecified: Secondary | ICD-10-CM | POA: Diagnosis not present

## 2022-09-03 ENCOUNTER — Other Ambulatory Visit: Payer: Self-pay | Admitting: Family Medicine

## 2022-09-03 DIAGNOSIS — Z0000 Encounter for general adult medical examination without abnormal findings: Secondary | ICD-10-CM

## 2022-10-27 ENCOUNTER — Ambulatory Visit
Admission: RE | Admit: 2022-10-27 | Discharge: 2022-10-27 | Disposition: A | Discharge status: Home / Self Care | Payer: 59 | Source: Ambulatory Visit | Attending: Family Medicine | Admitting: Family Medicine

## 2022-10-27 DIAGNOSIS — Z1231 Encounter for screening mammogram for malignant neoplasm of breast: Secondary | ICD-10-CM | POA: Diagnosis not present

## 2022-10-27 DIAGNOSIS — Z0000 Encounter for general adult medical examination without abnormal findings: Secondary | ICD-10-CM

## 2022-12-29 DIAGNOSIS — Z09 Encounter for follow-up examination after completed treatment for conditions other than malignant neoplasm: Secondary | ICD-10-CM | POA: Diagnosis not present

## 2022-12-29 DIAGNOSIS — Q438 Other specified congenital malformations of intestine: Secondary | ICD-10-CM | POA: Diagnosis not present

## 2022-12-29 DIAGNOSIS — Z860101 Personal history of adenomatous and serrated colon polyps: Secondary | ICD-10-CM | POA: Diagnosis not present

## 2023-01-25 DIAGNOSIS — D485 Neoplasm of uncertain behavior of skin: Secondary | ICD-10-CM | POA: Diagnosis not present

## 2023-01-25 DIAGNOSIS — L820 Inflamed seborrheic keratosis: Secondary | ICD-10-CM | POA: Diagnosis not present

## 2023-01-25 DIAGNOSIS — L821 Other seborrheic keratosis: Secondary | ICD-10-CM | POA: Diagnosis not present

## 2023-01-25 DIAGNOSIS — D2372 Other benign neoplasm of skin of left lower limb, including hip: Secondary | ICD-10-CM | POA: Diagnosis not present

## 2023-01-25 DIAGNOSIS — D1801 Hemangioma of skin and subcutaneous tissue: Secondary | ICD-10-CM | POA: Diagnosis not present

## 2023-01-25 DIAGNOSIS — Z85820 Personal history of malignant melanoma of skin: Secondary | ICD-10-CM | POA: Diagnosis not present

## 2023-01-25 DIAGNOSIS — L570 Actinic keratosis: Secondary | ICD-10-CM | POA: Diagnosis not present

## 2023-01-25 DIAGNOSIS — D225 Melanocytic nevi of trunk: Secondary | ICD-10-CM | POA: Diagnosis not present

## 2023-05-17 DIAGNOSIS — Z7983 Long term (current) use of bisphosphonates: Secondary | ICD-10-CM | POA: Diagnosis not present

## 2023-05-17 DIAGNOSIS — Z8249 Family history of ischemic heart disease and other diseases of the circulatory system: Secondary | ICD-10-CM | POA: Diagnosis not present

## 2023-05-17 DIAGNOSIS — M810 Age-related osteoporosis without current pathological fracture: Secondary | ICD-10-CM | POA: Diagnosis not present

## 2023-05-17 DIAGNOSIS — Z882 Allergy status to sulfonamides status: Secondary | ICD-10-CM | POA: Diagnosis not present

## 2023-05-17 DIAGNOSIS — Z823 Family history of stroke: Secondary | ICD-10-CM | POA: Diagnosis not present

## 2023-05-17 DIAGNOSIS — Z85820 Personal history of malignant melanoma of skin: Secondary | ICD-10-CM | POA: Diagnosis not present

## 2023-07-27 DIAGNOSIS — M79605 Pain in left leg: Secondary | ICD-10-CM | POA: Diagnosis not present

## 2023-07-27 DIAGNOSIS — M79661 Pain in right lower leg: Secondary | ICD-10-CM | POA: Diagnosis not present

## 2023-07-27 DIAGNOSIS — I83813 Varicose veins of bilateral lower extremities with pain: Secondary | ICD-10-CM | POA: Diagnosis not present

## 2023-07-27 DIAGNOSIS — M79604 Pain in right leg: Secondary | ICD-10-CM | POA: Diagnosis not present

## 2023-07-27 DIAGNOSIS — M7989 Other specified soft tissue disorders: Secondary | ICD-10-CM | POA: Diagnosis not present

## 2023-07-27 DIAGNOSIS — M79662 Pain in left lower leg: Secondary | ICD-10-CM | POA: Diagnosis not present

## 2023-08-04 DIAGNOSIS — Z0000 Encounter for general adult medical examination without abnormal findings: Secondary | ICD-10-CM | POA: Diagnosis not present

## 2023-08-04 DIAGNOSIS — E785 Hyperlipidemia, unspecified: Secondary | ICD-10-CM | POA: Diagnosis not present

## 2023-08-04 DIAGNOSIS — M818 Other osteoporosis without current pathological fracture: Secondary | ICD-10-CM | POA: Diagnosis not present

## 2023-08-17 DIAGNOSIS — E875 Hyperkalemia: Secondary | ICD-10-CM | POA: Diagnosis not present

## 2023-09-29 ENCOUNTER — Other Ambulatory Visit: Payer: Self-pay | Admitting: Family Medicine

## 2023-09-29 DIAGNOSIS — Z1231 Encounter for screening mammogram for malignant neoplasm of breast: Secondary | ICD-10-CM

## 2023-10-28 ENCOUNTER — Ambulatory Visit
Admission: RE | Admit: 2023-10-28 | Discharge: 2023-10-28 | Disposition: A | Discharge status: Home / Self Care | Source: Ambulatory Visit | Attending: Family Medicine | Admitting: Family Medicine

## 2023-10-28 DIAGNOSIS — Z1231 Encounter for screening mammogram for malignant neoplasm of breast: Secondary | ICD-10-CM
